# Patient Record
Sex: Male | Born: 2000
Health system: Southern US, Community
[De-identification: ages and names within clinical notes are randomized; demographics above are authoritative.]

## PROBLEM LIST (undated history)

## (undated) DIAGNOSIS — I861 Scrotal varices: Secondary | ICD-10-CM

## (undated) DIAGNOSIS — Z8249 Family history of ischemic heart disease and other diseases of the circulatory system: Secondary | ICD-10-CM

## (undated) DIAGNOSIS — S060X9A Concussion with loss of consciousness of unspecified duration, initial encounter: Secondary | ICD-10-CM

## (undated) DIAGNOSIS — S060XAA Concussion with loss of consciousness status unknown, initial encounter: Secondary | ICD-10-CM

## (undated) DIAGNOSIS — Z973 Presence of spectacles and contact lenses: Secondary | ICD-10-CM

## (undated) DIAGNOSIS — L709 Acne, unspecified: Secondary | ICD-10-CM

## (undated) HISTORY — PX: OTHER SURGICAL HISTORY: SHX169

## (undated) HISTORY — DX: Family history of ischemic heart disease and other diseases of the circulatory system: Z82.49

---
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
## 2022-06-20 ENCOUNTER — Other Ambulatory Visit (HOSPITAL_BASED_OUTPATIENT_CLINIC_OR_DEPARTMENT_OTHER): Payer: Self-pay

## 2022-06-20 MED ORDER — TRETINOIN 0.025 % EX CREA
TOPICAL_CREAM | CUTANEOUS | 6 refills | Status: AC
  Filled 2022-06-20: qty 45, 30d supply, fill #0
  Filled 2022-07-18: qty 45, 30d supply, fill #1
  Filled 2022-12-10: qty 45, 30d supply, fill #2
  Filled 2023-03-02: qty 45, 30d supply, fill #3
  Filled 2023-04-03: qty 45, 30d supply, fill #4

## 2022-07-18 ENCOUNTER — Other Ambulatory Visit (HOSPITAL_BASED_OUTPATIENT_CLINIC_OR_DEPARTMENT_OTHER): Payer: Self-pay

## 2022-08-15 IMAGING — MR MR LUMBAR SPINE W/O CM
4 of 5 series · 19 of 48 positions shown · non-contrast
Comparison: No pertinent prior exams available for comparison.

CLINICAL DATA: Low back pain, unspecified back pain laterality,
unspecified chronicity, unspecified whether sciatica present.
Additional history provided by scanning technologist: Patient
reports low back pain ongoing for 4 years, pain sometimes radiating
to legs.

EXAM:
MRI LUMBAR SPINE WITHOUT CONTRAST
TECHNIQUE: Multiplanar, multisequence MR imaging of the lumbar spine was
performed. No intravenous contrast was administered.

[Series 3: T2 · sagittal · 4.0mm · 0.55mm/px · 6 of 15 slices shown (1 of 2)]
[im 1/15]
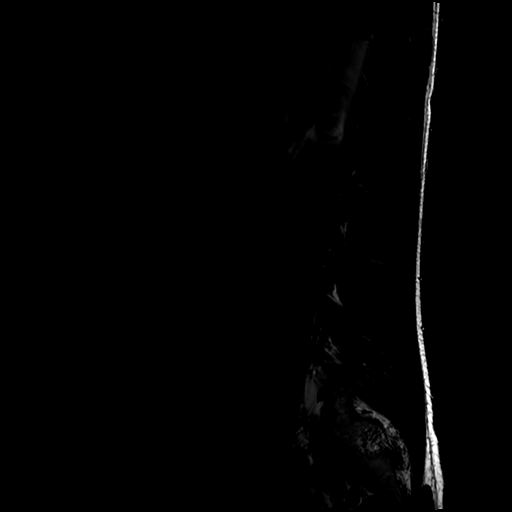
[im 3/15]
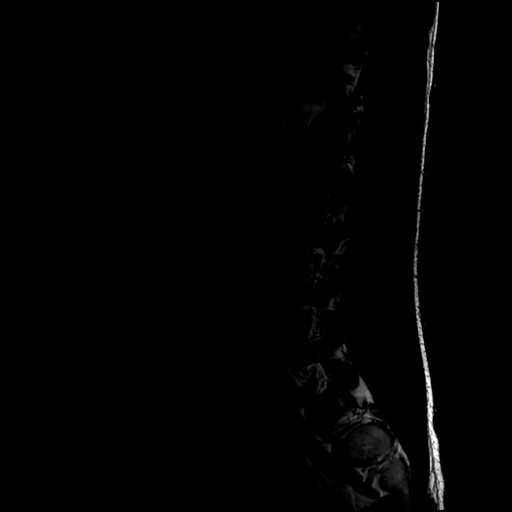
[im 6/15]
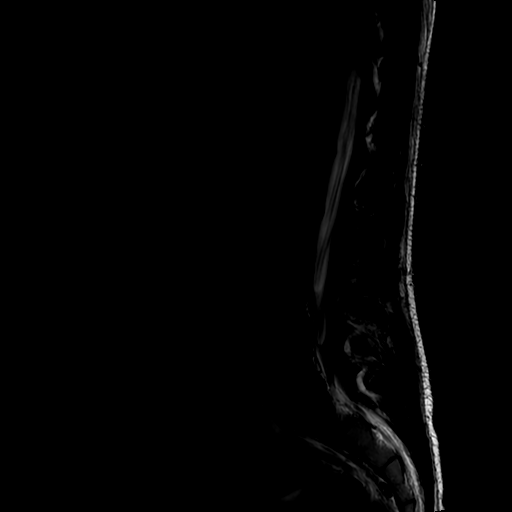
[im 9/15]
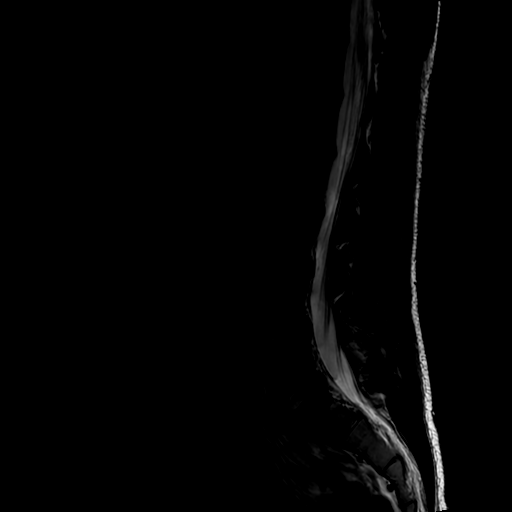
[im 12/15]
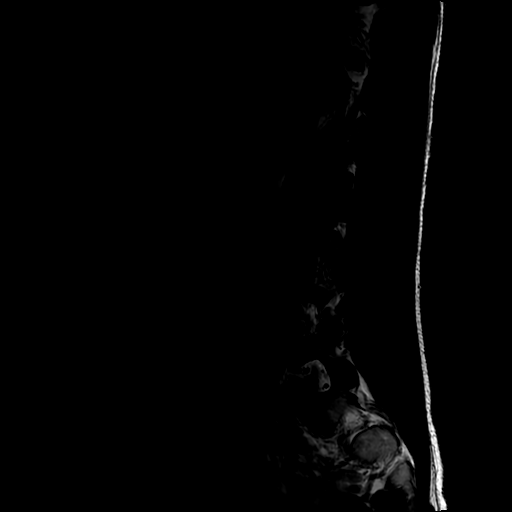
[im 15/15]
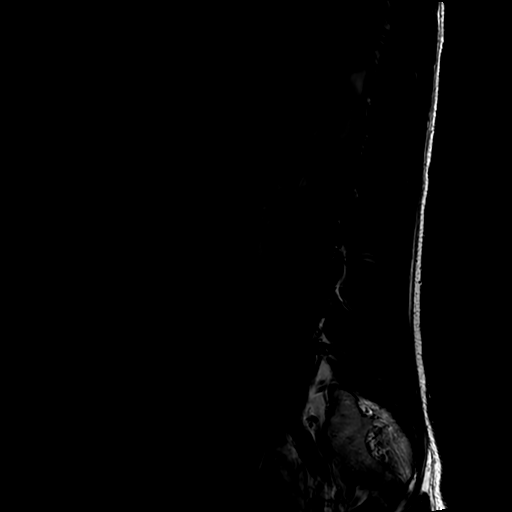

[Series 5: T1 · sagittal · 4.0mm · 0.51mm/px · 3 of 15 slices shown (1 of 2)]
[im 3/15]
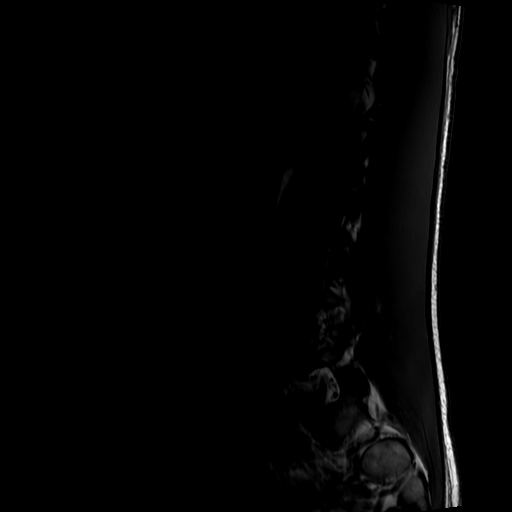
[im 9/15]
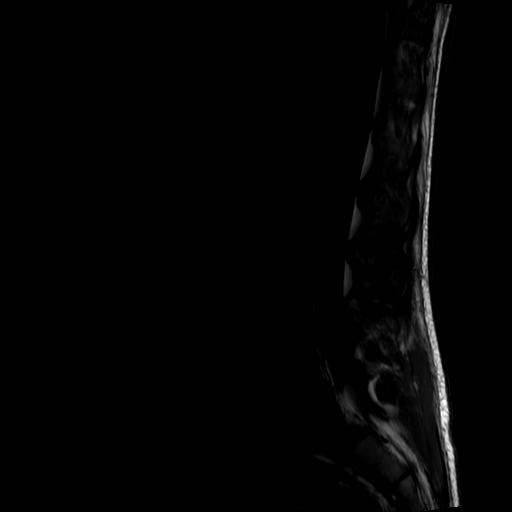
[im 15/15]
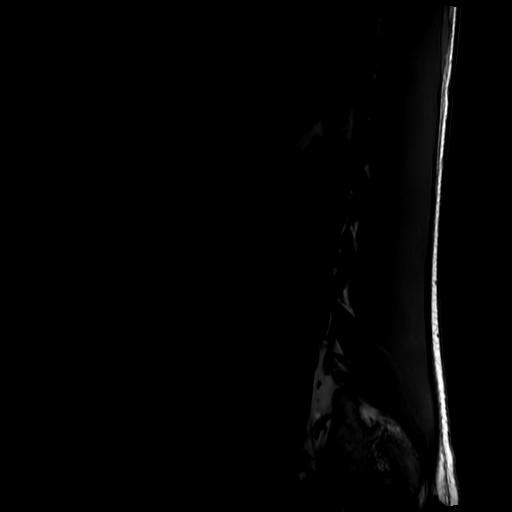

[Series 6: T2 · axial · 4.0mm · 0.39mm/px · z∈[-142,+36]mm · 7 of 36 slices shown (2 of 2)]
[im 1/36]
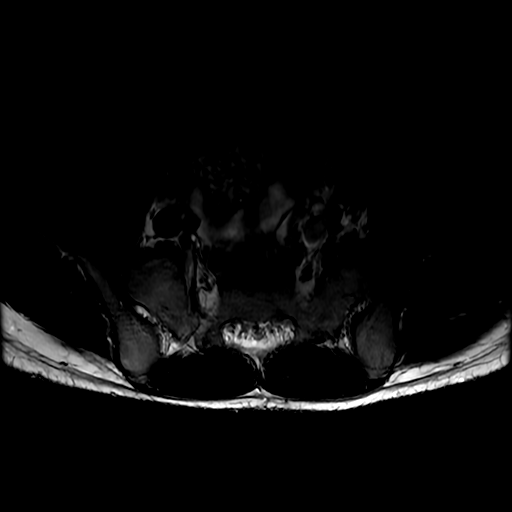
[im 6/36]
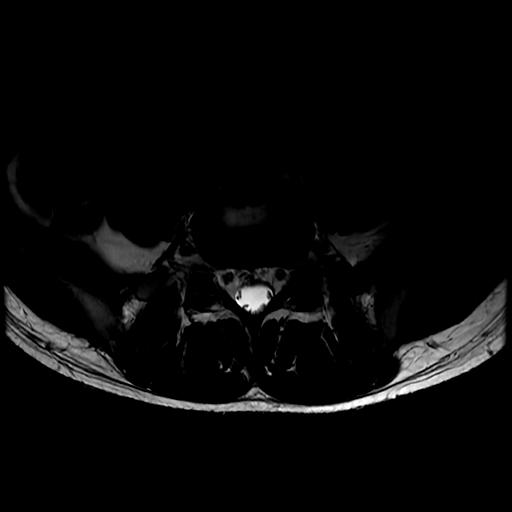
[im 11/36]
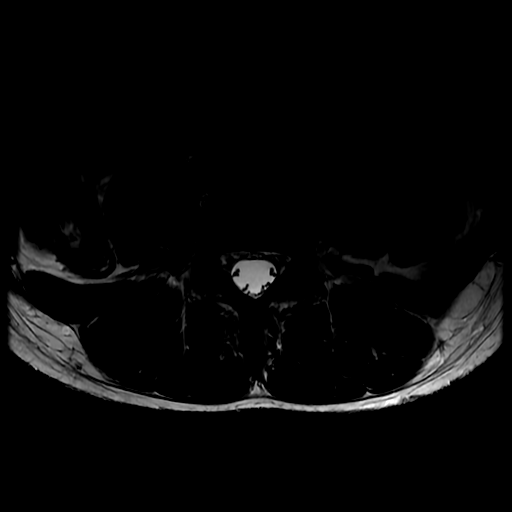
[im 16/36]
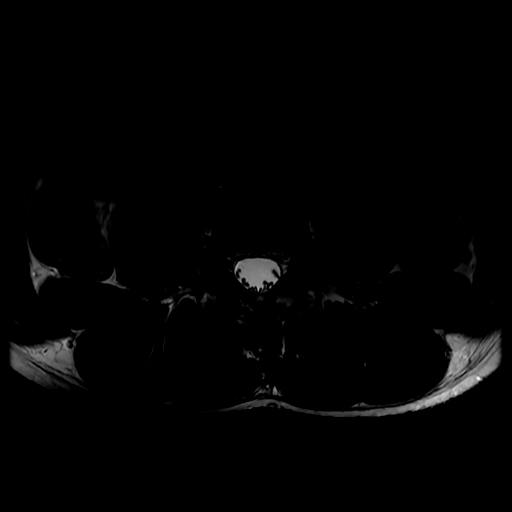
[im 18/36]
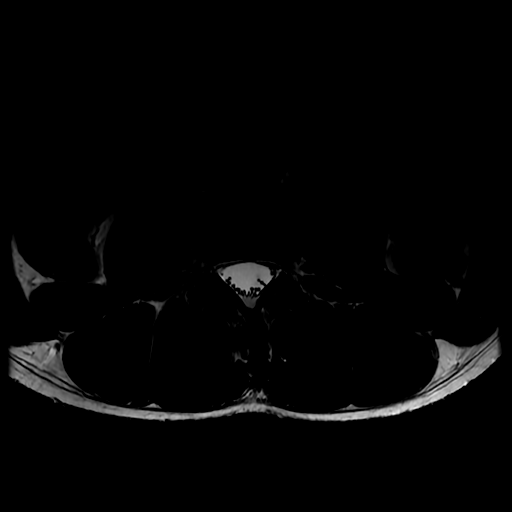
[im 21/36]
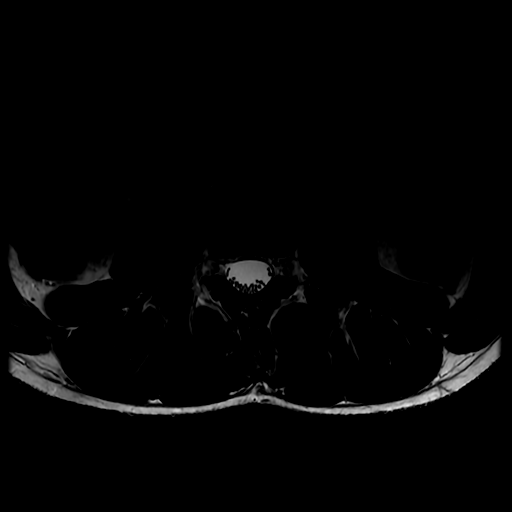
[im 31/36]
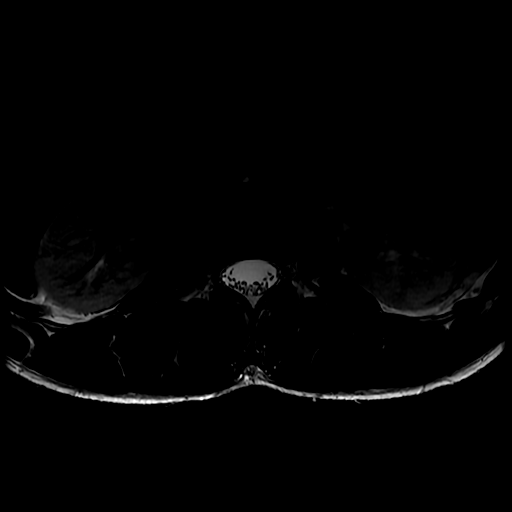

[Series 7: T1 · axial · 4.0mm · 0.39mm/px · z∈[-118,+36]mm · 3 of 36 slices shown (2 of 2)]
[im 6/36]
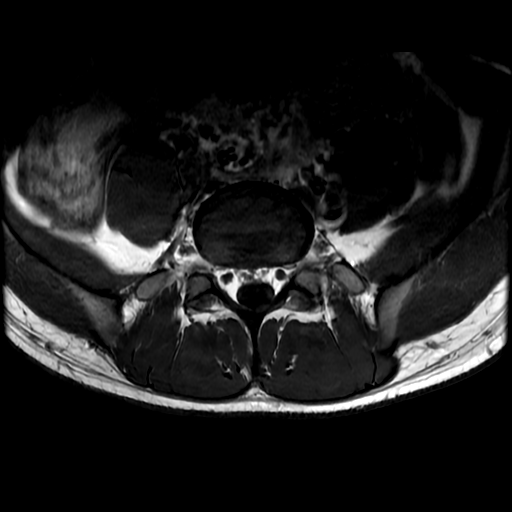
[im 18/36]
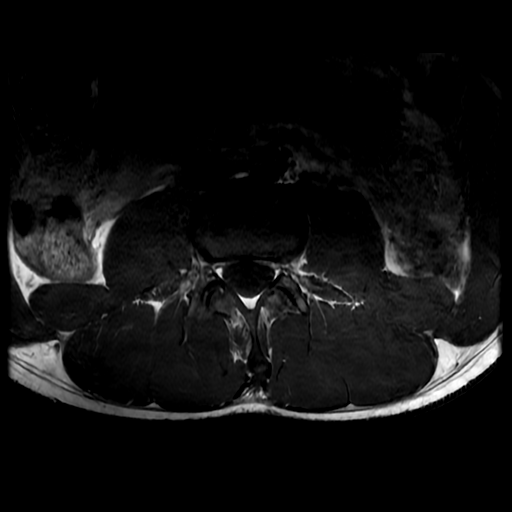
[im 31/36]
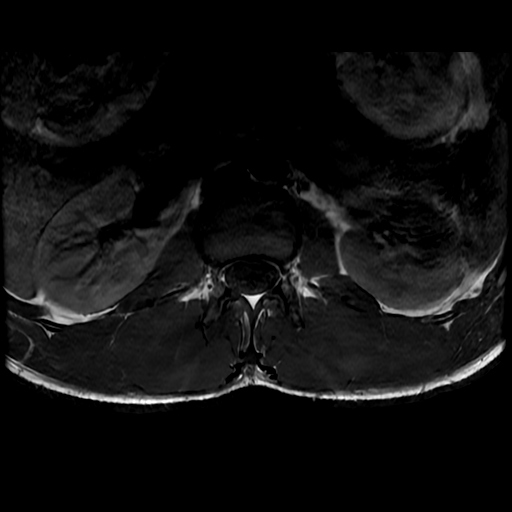

[19 of 48 positions shown; findings below may reference images not displayed]

FINDINGS: Segmentation: For the purposes of this dictation, five lumbar
vertebrae are assumed and the caudal most well-formed intervertebral
disc is designated L5-S1.

Alignment:  Trace L5-S1 grade 1 retrolisthesis.

Vertebrae: Vertebral body height is maintained. No significant
marrow edema or focal suspicious osseous lesion

Conus medullaris and cauda equina: Conus extends to the L1 level. No
signal abnormality within the visualized distal spinal cord.

Paraspinal and other soft tissues: No abnormality identified within
included portions of the abdomen/retroperitoneum. Paraspinal soft
tissues within normal limits.

Disc levels:

Intervertebral disc height and hydration are maintained.

At L5-S1, there is a tiny central disc protrusion at site of
posterior annular fissure (series 2, image 8) (series 3, image 8).
No significant spinal canal or foraminal stenosis.

No significant disc herniation, spinal canal stenosis or neural
foraminal narrowing at the remaining levels.
IMPRESSION: At L5-S1, there is trace grade 1 retrolisthesis. Tiny central disc
protrusion at site of posterior annular fissure. No significant
spinal canal or foraminal stenosis.

No significant disc herniation, spinal canal stenosis or neural
foraminal narrowing at the remaining levels.

## 2022-12-10 ENCOUNTER — Other Ambulatory Visit (HOSPITAL_BASED_OUTPATIENT_CLINIC_OR_DEPARTMENT_OTHER): Payer: Self-pay

## 2023-03-02 ENCOUNTER — Other Ambulatory Visit (HOSPITAL_BASED_OUTPATIENT_CLINIC_OR_DEPARTMENT_OTHER): Payer: Self-pay

## 2023-04-03 ENCOUNTER — Other Ambulatory Visit (HOSPITAL_BASED_OUTPATIENT_CLINIC_OR_DEPARTMENT_OTHER): Payer: Self-pay

## 2023-05-14 ENCOUNTER — Ambulatory Visit: Payer: Self-pay | Admitting: Internal Medicine

## 2023-05-15 ENCOUNTER — Encounter: Payer: Self-pay | Admitting: Internal Medicine

## 2023-05-15 ENCOUNTER — Ambulatory Visit: Payer: Commercial Managed Care - PPO | Attending: Genetic Counselor | Admitting: Genetic Counselor

## 2023-05-15 ENCOUNTER — Ambulatory Visit: Payer: Commercial Managed Care - PPO | Attending: Internal Medicine | Admitting: Internal Medicine

## 2023-05-15 VITALS — BP 100/58 | HR 70 | Ht 66.0 in | Wt 132.6 lb

## 2023-05-15 DIAGNOSIS — Z8249 Family history of ischemic heart disease and other diseases of the circulatory system: Secondary | ICD-10-CM
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
## 2023-05-15 NOTE — Patient Instructions (Signed)
Medication Instructions:  Your physician recommends that you continue on your current medications as directed. Please refer to the Current Medication list given to you today.  *If you need a refill on your cardiac medications before your next appointment, please call your pharmacy*   Lab Work: NONE  If you have labs (blood work) drawn today and your tests are completely normal, you will receive your results only by: MyChart Message (if you have MyChart) OR A paper copy in the mail If you have any lab test that is abnormal or we need to change your treatment, we will call you to review the results.   Testing/Procedures: Your physician has requested that you have an echocardiogram. Echocardiography is a painless test that uses sound waves to create images of your heart. It provides your doctor with information about the size and shape of your heart and how well your heart's chambers and valves are working. This procedure takes approximately one hour. There are no restrictions for this procedure. Please do NOT wear cologne, perfume, aftershave, or lotions (deodorant is allowed). Please arrive 15 minutes prior to your appointment time.    Follow-Up: At [facility], you and your health needs are our priority.  As part of our continuing mission to provide you with exceptional heart care, we have created designated Provider Care Teams.  These Care Teams include your primary Cardiologist (physician) and Advanced Practice Providers (APPs -  Physician Assistants and Nurse Practitioners) who all work together to provide you with the care you need, when you need it.     Your next appointment:   2 year(s)  Provider:   Riley Lam, MD

## 2023-05-20 NOTE — Progress Notes (Signed)
Referring Provider: Riley Lam, MD   Pre Test Genetic Consult  Referral Reason  Philip Adkins is referred for genetic consult and testing for the familial pathogenic variant in MYH7 gene that was previously detected in his father with hypertrophic cardiomyopathy.  Personal Medical Information Philip Adkins (IV.2 on pedigree) is a 22 year old African American gentleman who is here to day with parents and his younger sister. He is asymptomatic and has had normal cardiac screening for HCM by echocardiogram and EKG for the last two years. He is scheduled to have one done in 26-Jun-2023.    Family history Philip Adkins's father is the proband of this family  Relation to Proband (Father, III.5) Pedigree # Current age Heart condition/age of onset Notes  Sons, 2 IV.1, IV.2 22, 20 None None, normal Echo/EKG screening  Daughter IV.3 18 None None, normal Echo/EKG screening        Brother III.3 deceased na Deceased @ 2mos-premature birth  Sisters, 3 III.1. III.2, III.4 68, 65, 58 None Echo/EKG normal in 2015/06/26        Father II.2 Deceased HCM dx @ 25 Died @ 48 from M.I.  Paternal aunt II.1 Deceased None Died @ 66 from [place] disease  Paternal grandfather I.1 Deceased ? Died @ ?  Paternal grandmother I.2 Deceased None Died of breast cancer        Mother II.3 Deceased None Died @ 67bfrom lung cancer  Maternal uncle, aunts II.4-II.19 ? ? ?  Maternal grandfather I.3 Deceased None Died @ 59  Maternal grandmother I.4 Deceased Congenital heart disease Died @ 34 in her sleep    Genetics Philip Adkins was counseled on the genetics of hypertrophic cardiomyopathy (HCM). I explained to the patient that this is an autosomal dominant condition with incomplete penetrance i.e. not all individuals harboring the HCM mutation will present clinically with HCM, and age-related penetrance where clinical presentation of HCM increases with advanced age. Variability in clinical expression is also seen in families with HCM with affected  family members presenting clinically at different ages and with symptoms ranging from mild to severe.  Since HCM is an autosomal dominant condition, he and his siblings are at a 50% risk of inheriting this condition. Clinical screening for HCM of first-degree relatives is recommended/ This involves echocardiogram and EKG at regular intervals, frequency is typically determined by age, with those in their teens undergoing screening every year until the age of 43 and those over the age of 33 getting screened every 3-5 years. Patient verbalized understanding of this and has had normal cardiac screens annually.  We also discussed the outcomes of genetic testing. Informed the patient that there is a 50% chance of inheriting the familial pathogenic variant.  If a mutation is not identified, then he need not undergo regular screening for HCM.   If he is found to have the pathogenic variant is reported, then it is likely they will develop HCM. In light of variable expression and incomplete penetrance associated with HCM, it is not possible to predict when they will manifest clinically with HCM. It is recommended that family members that test positive for the familial pathogenic variant pursue clinical screening for HCM. He expressed understanding   Impression Philip Adkins's father was found to have apical thickening of cardiac wall thickness suggestive of HCM at age 29 in the absence of other loading conditions that can cause cardiac hypertrophy. Philip Adkins is currently asymptomatic and had normal cardiac screenings for HCM.   Genetic testing for the familial pathogenic variant in  MYH7 for HCM is recommended.   In addition, we discussed the protections afforded by the Genetic Information Non-Discrimination Act (GINA). I explained to the patient that GINA protects them from losing their employment or health insurance based on their genotype. However, these protections do not cover life insurance and disability. Patient  verbalized understanding of this but the parents would like to confirm if they have a life insurance policy on him  Please note that the patient has not been counseled in this visit on other personal, cultural or ethical issues that they may face due to their heart condition.   Plan After a thorough discussion of the risk and benefits of genetic testing for HCM, Philip Adkins states his intent to pursue genetic testing for HCM. However, he will be back with all his siblings at a later date after he is sure that he has a life insurance plan in place    Sidney Ace, Ph.D, [facility] Clinical Molecular Geneticist

## 2023-06-13 ENCOUNTER — Ambulatory Visit (HOSPITAL_COMMUNITY): Payer: Commercial Managed Care - PPO | Attending: Cardiology

## 2023-06-13 DIAGNOSIS — Z8249 Family history of ischemic heart disease and other diseases of the circulatory system: Secondary | ICD-10-CM | POA: Insufficient documentation

## 2023-06-13 DIAGNOSIS — Z136 Encounter for screening for cardiovascular disorders: Secondary | ICD-10-CM | POA: Diagnosis not present

## 2023-06-13 LAB — ECHOCARDIOGRAM COMPLETE
Area-P 1/2: 3.81 cm2
Est EF: 55
S' Lateral: 3.3 cm

## 2023-06-14 ENCOUNTER — Other Ambulatory Visit: Payer: Self-pay

## 2023-06-14 DIAGNOSIS — Z8249 Family history of ischemic heart disease and other diseases of the circulatory system: Secondary | ICD-10-CM

## 2023-06-14 NOTE — Progress Notes (Signed)
Placed an order for echocardiogram due in 1 yr per MD result note: Testing Results WNL.   Echo in one year; then every 3-5 years unless genetic testing is done

## 2023-06-25 ENCOUNTER — Other Ambulatory Visit (HOSPITAL_BASED_OUTPATIENT_CLINIC_OR_DEPARTMENT_OTHER): Payer: Self-pay

## 2023-06-25 DIAGNOSIS — Z5181 Encounter for therapeutic drug level monitoring: Secondary | ICD-10-CM | POA: Diagnosis not present

## 2023-06-25 DIAGNOSIS — L700 Acne vulgaris: Secondary | ICD-10-CM | POA: Diagnosis not present

## 2023-06-25 MED ORDER — DOXYCYCLINE HYCLATE 100 MG PO CAPS
100.0000 mg | ORAL_CAPSULE | Freq: Two times a day (BID) | ORAL | 2 refills | Status: AC
  Filled 2023-06-25: qty 30, 15d supply, fill #0
  Filled 2023-07-08: qty 30, 15d supply, fill #1
  Filled 2024-05-23: qty 30, 15d supply, fill #2

## 2023-06-25 MED ORDER — AKLIEF 0.005 % EX CREA
TOPICAL_CREAM | CUTANEOUS | 2 refills | Status: AC
  Filled 2023-06-25: qty 45, 30d supply, fill #0
  Filled 2024-05-23: qty 45, 30d supply, fill #1

## 2023-06-26 ENCOUNTER — Other Ambulatory Visit (HOSPITAL_BASED_OUTPATIENT_CLINIC_OR_DEPARTMENT_OTHER): Payer: Self-pay

## 2023-07-03 DIAGNOSIS — H5213 Myopia, bilateral: Secondary | ICD-10-CM | POA: Diagnosis not present

## 2023-07-08 ENCOUNTER — Other Ambulatory Visit (HOSPITAL_BASED_OUTPATIENT_CLINIC_OR_DEPARTMENT_OTHER): Payer: Self-pay

## 2023-08-29 ENCOUNTER — Other Ambulatory Visit (HOSPITAL_BASED_OUTPATIENT_CLINIC_OR_DEPARTMENT_OTHER): Payer: Self-pay

## 2023-09-12 ENCOUNTER — Other Ambulatory Visit (HOSPITAL_BASED_OUTPATIENT_CLINIC_OR_DEPARTMENT_OTHER): Payer: Self-pay

## 2023-11-12 ENCOUNTER — Other Ambulatory Visit (HOSPITAL_BASED_OUTPATIENT_CLINIC_OR_DEPARTMENT_OTHER): Payer: Self-pay

## 2023-11-12 MED ORDER — INFLUENZA VIRUS VACC SPLIT PF (FLUZONE) 0.5 ML IM SUSY
0.5000 mL | PREFILLED_SYRINGE | Freq: Once | INTRAMUSCULAR | 0 refills | Status: AC
  Filled 2023-11-12: qty 0.5, 1d supply, fill #0

## 2023-11-12 MED ORDER — COVID-19 MRNA VAC-TRIS(PFIZER) 30 MCG/0.3ML IM SUSY
0.3000 mL | PREFILLED_SYRINGE | Freq: Once | INTRAMUSCULAR | 0 refills | Status: AC
  Filled 2023-11-12: qty 0.3, 1d supply, fill #0

## 2023-11-23 ENCOUNTER — Other Ambulatory Visit (HOSPITAL_BASED_OUTPATIENT_CLINIC_OR_DEPARTMENT_OTHER): Payer: Self-pay

## 2023-11-25 ENCOUNTER — Other Ambulatory Visit (HOSPITAL_BASED_OUTPATIENT_CLINIC_OR_DEPARTMENT_OTHER): Payer: Self-pay

## 2023-11-25 MED ORDER — TOBRAMYCIN-DEXAMETHASONE 0.3-0.1 % OP SUSP
1.0000 [drp] | Freq: Two times a day (BID) | OPHTHALMIC | 0 refills | Status: AC
  Filled 2023-11-25: qty 5, 7d supply, fill #0

## 2024-05-15 ENCOUNTER — Encounter (HOSPITAL_COMMUNITY): Payer: Self-pay | Admitting: Internal Medicine

## 2024-05-25 ENCOUNTER — Other Ambulatory Visit: Payer: Self-pay

## 2024-05-26 ENCOUNTER — Telehealth: Payer: Self-pay | Admitting: Internal Medicine

## 2024-05-26 ENCOUNTER — Other Ambulatory Visit (HOSPITAL_BASED_OUTPATIENT_CLINIC_OR_DEPARTMENT_OTHER): Payer: Self-pay

## 2024-05-26 DIAGNOSIS — Z8249 Family history of ischemic heart disease and other diseases of the circulatory system: Secondary | ICD-10-CM

## 2024-05-26 NOTE — Telephone Encounter (Signed)
 Please review

## 2024-05-26 NOTE — Telephone Encounter (Signed)
  Patient sent a mychart message requesting to schedule an echo. Looks like order was placed last year for this year but order was cancelled. Does patient need to have an echo done this year?

## 2024-05-27 DIAGNOSIS — M79674 Pain in right toe(s): Secondary | ICD-10-CM | POA: Diagnosis not present

## 2024-05-27 DIAGNOSIS — M79671 Pain in right foot: Secondary | ICD-10-CM | POA: Diagnosis not present

## 2024-05-27 DIAGNOSIS — M7661 Achilles tendinitis, right leg: Secondary | ICD-10-CM | POA: Diagnosis not present

## 2024-06-08 ENCOUNTER — Ambulatory Visit (HOSPITAL_COMMUNITY)
Admission: RE | Admit: 2024-06-08 | Discharge: 2024-06-08 | Disposition: A | Discharge status: Home / Self Care | Payer: Self-pay | Source: Ambulatory Visit | Attending: Internal Medicine | Admitting: Internal Medicine

## 2024-06-08 DIAGNOSIS — Z8249 Family history of ischemic heart disease and other diseases of the circulatory system: Secondary | ICD-10-CM | POA: Diagnosis not present

## 2024-06-08 DIAGNOSIS — Z0389 Encounter for observation for other suspected diseases and conditions ruled out: Secondary | ICD-10-CM | POA: Diagnosis not present

## 2024-06-08 LAB — ECHOCARDIOGRAM COMPLETE
AR max vel: 2.98 cm2
AV Area VTI: 2.72 cm2
AV Area mean vel: 2.79 cm2
AV Mean grad: 3 mmHg
AV Peak grad: 5.8 mmHg
Ao pk vel: 1.2 m/s
Area-P 1/2: 4.63 cm2
S' Lateral: 2.9 cm

## 2024-06-14 ENCOUNTER — Ambulatory Visit: Payer: Self-pay | Admitting: Internal Medicine

## 2024-06-14 DIAGNOSIS — Z8249 Family history of ischemic heart disease and other diseases of the circulatory system: Secondary | ICD-10-CM

## 2024-06-30 DIAGNOSIS — Z7185 Encounter for immunization safety counseling: Secondary | ICD-10-CM | POA: Diagnosis not present

## 2024-06-30 DIAGNOSIS — Z8249 Family history of ischemic heart disease and other diseases of the circulatory system: Secondary | ICD-10-CM | POA: Diagnosis not present

## 2024-06-30 DIAGNOSIS — Z23 Encounter for immunization: Secondary | ICD-10-CM | POA: Diagnosis not present

## 2024-06-30 DIAGNOSIS — L709 Acne, unspecified: Secondary | ICD-10-CM | POA: Diagnosis not present

## 2024-07-03 DIAGNOSIS — Z0000 Encounter for general adult medical examination without abnormal findings: Secondary | ICD-10-CM | POA: Diagnosis not present

## 2024-07-07 ENCOUNTER — Ambulatory Visit: Attending: Genetic Counselor | Admitting: Genetic Counselor

## 2024-07-07 DIAGNOSIS — Z8249 Family history of ischemic heart disease and other diseases of the circulatory system: Secondary | ICD-10-CM

## 2024-07-13 ENCOUNTER — Other Ambulatory Visit (HOSPITAL_COMMUNITY): Payer: Self-pay

## 2024-07-14 ENCOUNTER — Other Ambulatory Visit (HOSPITAL_BASED_OUTPATIENT_CLINIC_OR_DEPARTMENT_OTHER): Payer: Self-pay

## 2024-07-14 DIAGNOSIS — L700 Acne vulgaris: Secondary | ICD-10-CM | POA: Diagnosis not present

## 2024-07-14 DIAGNOSIS — Z5181 Encounter for therapeutic drug level monitoring: Secondary | ICD-10-CM | POA: Diagnosis not present

## 2024-07-14 DIAGNOSIS — Z7189 Other specified counseling: Secondary | ICD-10-CM | POA: Diagnosis not present

## 2024-07-14 MED ORDER — ISOTRETINOIN 30 MG PO CAPS
30.0000 mg | ORAL_CAPSULE | Freq: Every day | ORAL | 0 refills | Status: AC
Start: 2024-07-14 — End: ?
  Filled 2024-07-14: qty 30, 30d supply, fill #0

## 2024-07-15 NOTE — Progress Notes (Signed)
 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 +/- that was previously detected in his father with HCM.  He was previously seen by me in June 2024. His recent cardiac imaging study is normal as are that of his siblings who were tested within the last 2 years. He reports no changes in his paternal family history.   I explained to him that as HCM is an autosomal dominant condition, he and his siblings are at a 50% risk of inheriting this condition. Clinical screening for HCM of first-degree relatives is recommended and they have been doing so over the last few years.   In addition, we discussed the protections afforded by the Genetic Information Non-Discrimination Act (GINA). I explained to the patient that GINA protects them from losing their employment or health insurance based on their genotype. However, these protections do not cover life insurance and disability.   Patient verbalized understanding of this and tells me that he has a life insurance plan that is tied with a retirement base. He is working on changing his plan to that of a regular life insurance plan. He expresses interest in pursuing genetic testing, but wishes to wait until the life insurance switch is in place.   Philip Adkins will reach out to us  for genetic testing once this issue has been resolved.  Philip Adkins, Ph.D, [facility] Clinical Molecular Geneticist

## 2024-08-04 DIAGNOSIS — S99912A Unspecified injury of left ankle, initial encounter: Secondary | ICD-10-CM | POA: Diagnosis not present

## 2024-08-04 DIAGNOSIS — S92252A Displaced fracture of navicular [scaphoid] of left foot, initial encounter for closed fracture: Secondary | ICD-10-CM | POA: Diagnosis not present

## 2024-08-04 DIAGNOSIS — S99922A Unspecified injury of left foot, initial encounter: Secondary | ICD-10-CM | POA: Diagnosis not present

## 2024-08-07 DIAGNOSIS — L700 Acne vulgaris: Secondary | ICD-10-CM | POA: Diagnosis not present

## 2024-08-10 DIAGNOSIS — Z7189 Other specified counseling: Secondary | ICD-10-CM | POA: Diagnosis not present

## 2024-08-10 DIAGNOSIS — L700 Acne vulgaris: Secondary | ICD-10-CM | POA: Diagnosis not present

## 2024-08-10 DIAGNOSIS — Z5181 Encounter for therapeutic drug level monitoring: Secondary | ICD-10-CM | POA: Diagnosis not present

## 2024-08-14 DIAGNOSIS — M79672 Pain in left foot: Secondary | ICD-10-CM | POA: Diagnosis not present

## 2024-08-14 DIAGNOSIS — M79671 Pain in right foot: Secondary | ICD-10-CM | POA: Diagnosis not present

## 2024-09-10 DIAGNOSIS — L700 Acne vulgaris: Secondary | ICD-10-CM | POA: Diagnosis not present

## 2024-09-14 DIAGNOSIS — Z7189 Other specified counseling: Secondary | ICD-10-CM | POA: Diagnosis not present

## 2024-09-14 DIAGNOSIS — L700 Acne vulgaris: Secondary | ICD-10-CM | POA: Diagnosis not present

## 2024-09-14 DIAGNOSIS — Z5181 Encounter for therapeutic drug level monitoring: Secondary | ICD-10-CM | POA: Diagnosis not present

## 2024-10-16 DIAGNOSIS — Z7189 Other specified counseling: Secondary | ICD-10-CM | POA: Diagnosis not present

## 2024-10-16 DIAGNOSIS — Z5181 Encounter for therapeutic drug level monitoring: Secondary | ICD-10-CM | POA: Diagnosis not present

## 2024-10-16 DIAGNOSIS — L700 Acne vulgaris: Secondary | ICD-10-CM | POA: Diagnosis not present

## 2024-11-03 ENCOUNTER — Other Ambulatory Visit (HOSPITAL_BASED_OUTPATIENT_CLINIC_OR_DEPARTMENT_OTHER): Payer: Self-pay

## 2024-11-03 MED ORDER — ISOTRETINOIN 30 MG PO CAPS
30.0000 mg | ORAL_CAPSULE | Freq: Two times a day (BID) | ORAL | 0 refills | Status: DC
  Filled 2024-11-03: qty 60, 30d supply, fill #0

## 2024-11-20 ENCOUNTER — Other Ambulatory Visit (HOSPITAL_BASED_OUTPATIENT_CLINIC_OR_DEPARTMENT_OTHER): Payer: Self-pay

## 2024-11-20 MED ORDER — ISOTRETINOIN 30 MG PO CAPS
30.0000 mg | ORAL_CAPSULE | Freq: Two times a day (BID) | ORAL | 0 refills | Status: AC
  Filled 2024-11-20 – 2024-11-27 (×2): qty 60, 30d supply, fill #0

## 2024-11-27 ENCOUNTER — Other Ambulatory Visit (HOSPITAL_BASED_OUTPATIENT_CLINIC_OR_DEPARTMENT_OTHER): Payer: Self-pay

## 2024-11-30 ENCOUNTER — Other Ambulatory Visit (HOSPITAL_COMMUNITY): Payer: Self-pay

## 2024-11-30 ENCOUNTER — Other Ambulatory Visit (HOSPITAL_BASED_OUTPATIENT_CLINIC_OR_DEPARTMENT_OTHER): Payer: Self-pay

## 2024-12-01 ENCOUNTER — Other Ambulatory Visit (HOSPITAL_BASED_OUTPATIENT_CLINIC_OR_DEPARTMENT_OTHER): Payer: Self-pay

## 2024-12-02 ENCOUNTER — Other Ambulatory Visit (HOSPITAL_BASED_OUTPATIENT_CLINIC_OR_DEPARTMENT_OTHER): Payer: Self-pay
